# Patient Record
Sex: Female | Born: 1975 | Race: Black or African American | Hispanic: No | Marital: Single | Smoking: Never smoker
Health system: Southern US, Community
[De-identification: ages and names within clinical notes are randomized; demographics above are authoritative.]

---

## 1997-09-08 ENCOUNTER — Emergency Department (HOSPITAL_COMMUNITY): Admission: EM | Admit: 1997-09-08 | Discharge: 1997-09-08 | Payer: Self-pay | Admitting: Emergency Medicine

## 1997-12-23 ENCOUNTER — Emergency Department (HOSPITAL_COMMUNITY): Admission: EM | Admit: 1997-12-23 | Discharge: 1997-12-23 | Payer: Self-pay | Admitting: Internal Medicine

## 1997-12-23 ENCOUNTER — Encounter: Payer: Self-pay | Admitting: Internal Medicine

## 1999-01-07 ENCOUNTER — Inpatient Hospital Stay (HOSPITAL_COMMUNITY): Admission: AD | Admit: 1999-01-07 | Discharge: 1999-01-07 | Payer: Self-pay | Admitting: Obstetrics and Gynecology

## 1999-05-11 ENCOUNTER — Other Ambulatory Visit: Admission: RE | Admit: 1999-05-11 | Discharge: 1999-05-11 | Payer: Self-pay | Admitting: Obstetrics & Gynecology

## 1999-11-21 ENCOUNTER — Emergency Department (HOSPITAL_COMMUNITY): Admission: EM | Admit: 1999-11-21 | Discharge: 1999-11-22 | Payer: Self-pay | Admitting: Emergency Medicine

## 1999-11-21 ENCOUNTER — Encounter: Payer: Self-pay | Admitting: Emergency Medicine

## 1999-12-18 ENCOUNTER — Emergency Department (HOSPITAL_COMMUNITY): Admission: EM | Admit: 1999-12-18 | Discharge: 1999-12-18 | Payer: Self-pay

## 2003-04-24 ENCOUNTER — Ambulatory Visit (HOSPITAL_COMMUNITY): Admission: AD | Admit: 2003-04-24 | Discharge: 2003-04-24 | Payer: Self-pay | Admitting: *Deleted

## 2003-05-31 ENCOUNTER — Emergency Department (HOSPITAL_COMMUNITY): Admission: EM | Admit: 2003-05-31 | Discharge: 2003-05-31 | Payer: Self-pay | Admitting: Emergency Medicine

## 2004-08-02 ENCOUNTER — Inpatient Hospital Stay (HOSPITAL_COMMUNITY): Admission: AD | Admit: 2004-08-02 | Discharge: 2004-08-02 | Payer: Self-pay | Admitting: Obstetrics and Gynecology

## 2004-08-03 ENCOUNTER — Inpatient Hospital Stay (HOSPITAL_COMMUNITY): Admission: AD | Admit: 2004-08-03 | Discharge: 2004-08-03 | Payer: Self-pay | Admitting: Obstetrics and Gynecology

## 2004-10-03 ENCOUNTER — Inpatient Hospital Stay (HOSPITAL_COMMUNITY): Admission: AD | Admit: 2004-10-03 | Discharge: 2004-10-05 | Payer: Self-pay | Admitting: Obstetrics and Gynecology

## 2004-12-27 ENCOUNTER — Inpatient Hospital Stay (HOSPITAL_COMMUNITY): Admission: AD | Admit: 2004-12-27 | Discharge: 2004-12-27 | Payer: Self-pay | Admitting: Obstetrics & Gynecology

## 2005-01-13 IMAGING — US US TRANSVAGINAL NON-OB
1 series · 18 of 25 positions shown · non-contrast
Comparison: none

CLINICAL DATA: Patient is status post therapeutic abortion on 03/25/03.  She has had severe bleeding and pain and currently still has a positive urine pregnancy test.
 TRANSVAGINAL NONOBSTETRIC PELVIC ULTRASOUND, 04/24/03
 Uterus measures 10 x 5.7 x 5.9 cm.  There is asymmetry of the endometrium and endometrial cavity.  At the level of the fundus, a normal-appearing endometrium measures 6 mm in thickness and is homogeneous.  In the lower uterine segment, the endometrium is distorted and measures just over 2 cm in thickness.  Focal retained products are suspected in the lower uterine segment.  No uterine masses otherwise.
 Right ovary measures 2.9 x 1.4 x 1.1 cm and the left ovary 2.5 x 1.3 x 1.3 cm.  Both have a normal appearance.  No evidence of free fluid in the pelvis.
 IMPRESSION 
 Asymmetry of endometrium with heterogeneous and thickened endometrium measuring 2.1 cm in the lower uterine segment.  These findings are suspicious for retained products of conception.

[Series 1: us transvaginal non-ob · 18 of 32 slices shown]
[im 1/32]
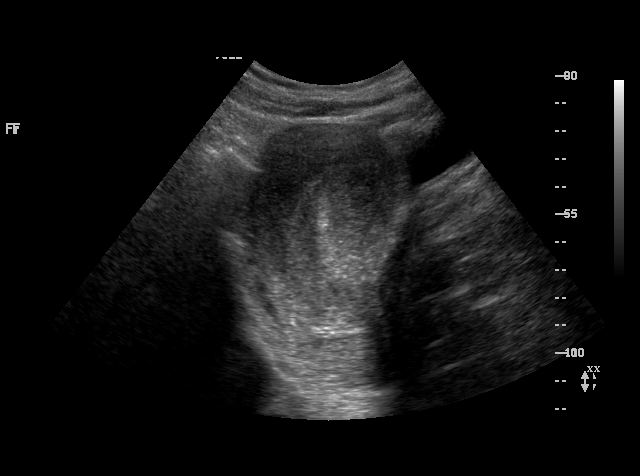
[im 3/32]
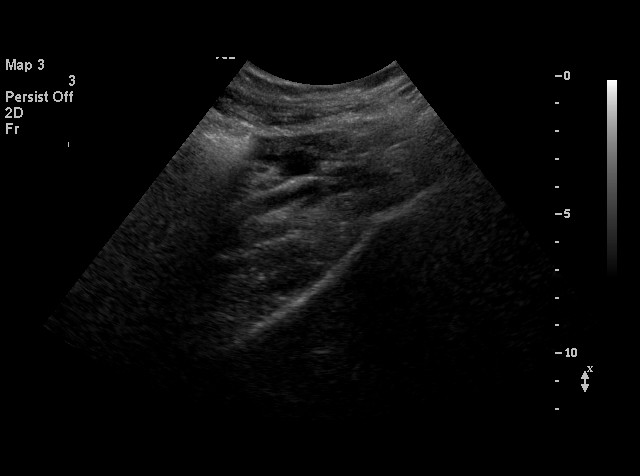
[im 4/32]
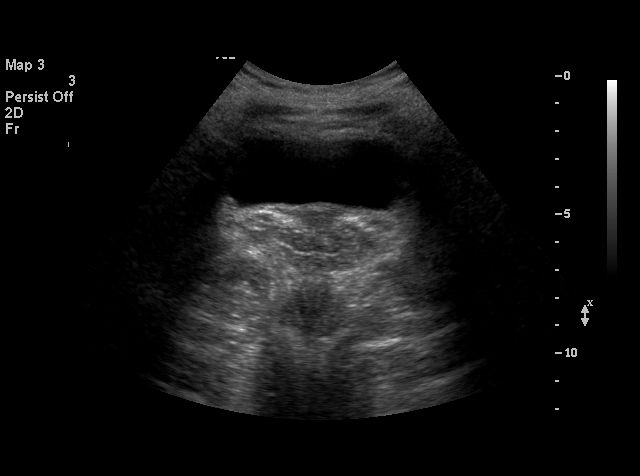
[im 6/32]
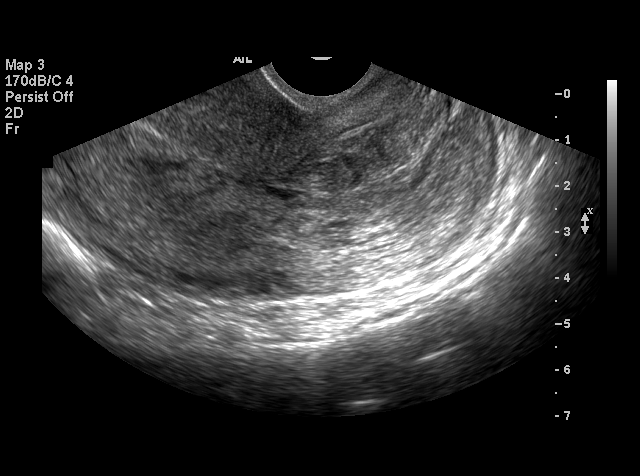
[im 8/32]
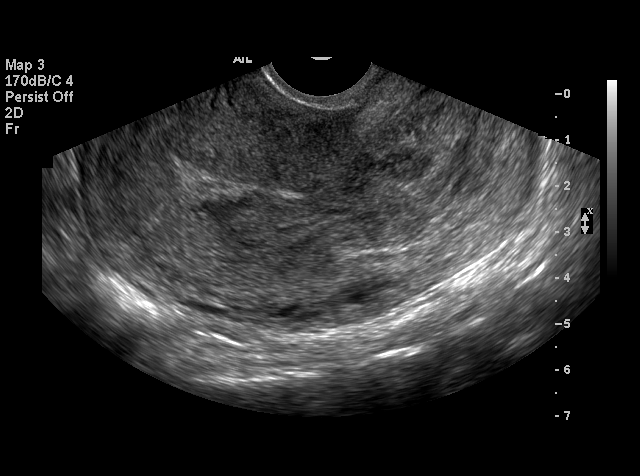
[im 10/32]
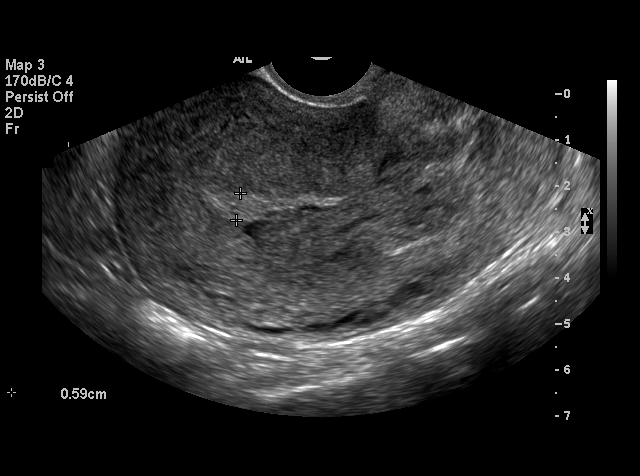
[im 12/32]
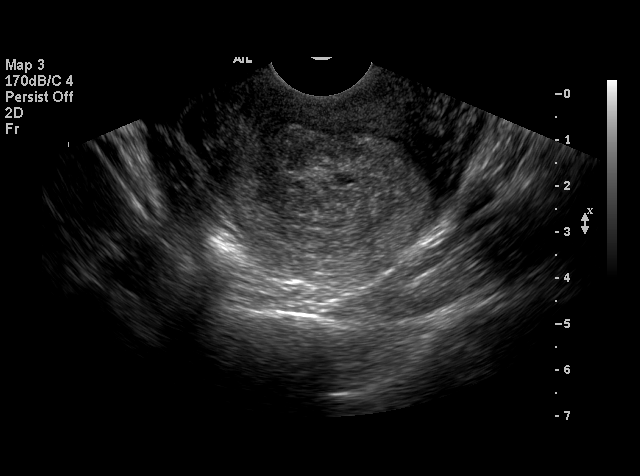
[im 13/32]
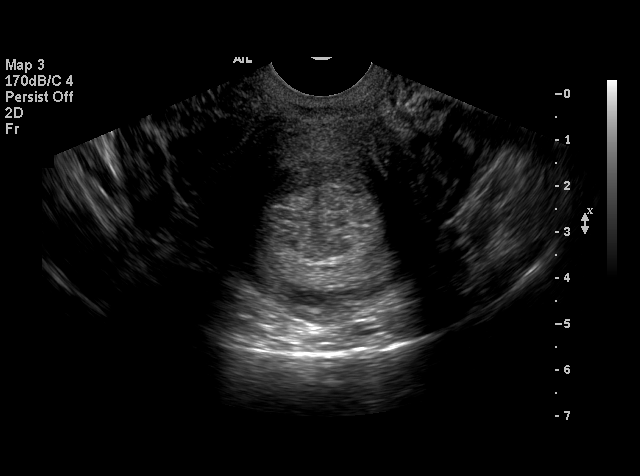
[im 15/32]
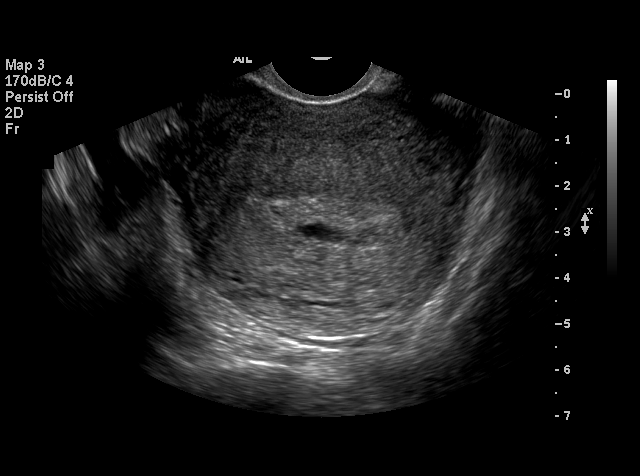
[im 17/32]
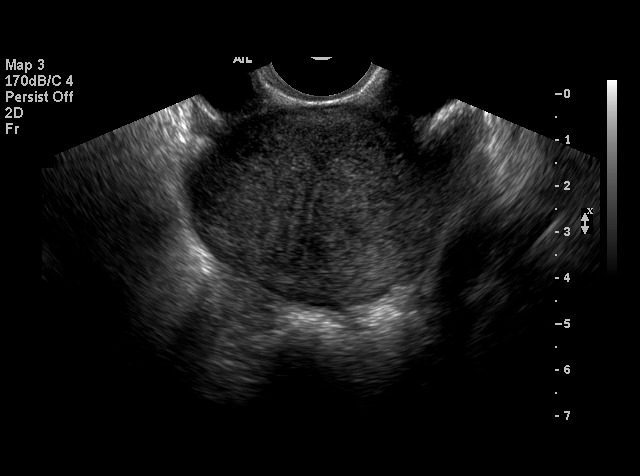
[im 19/32]
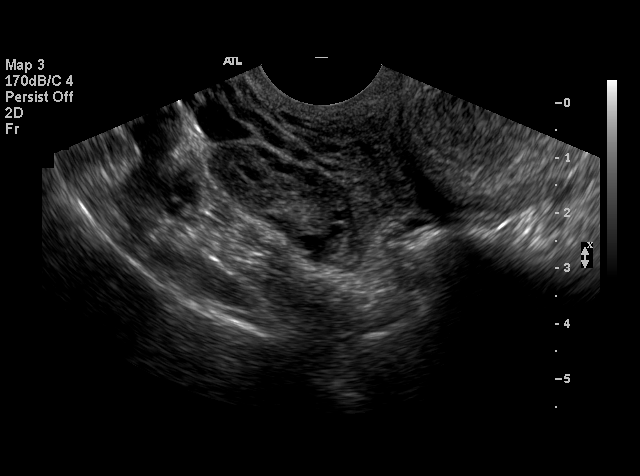
[im 20/32]
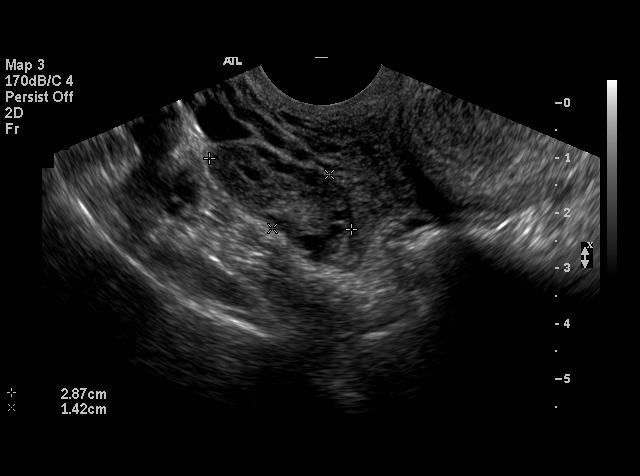
[im 22/32]
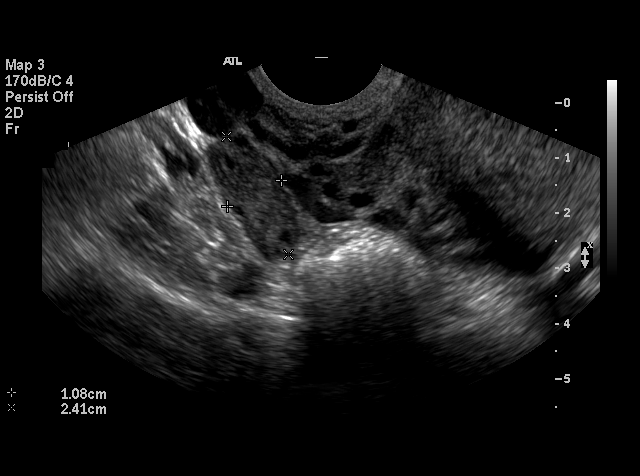
[im 24/32]
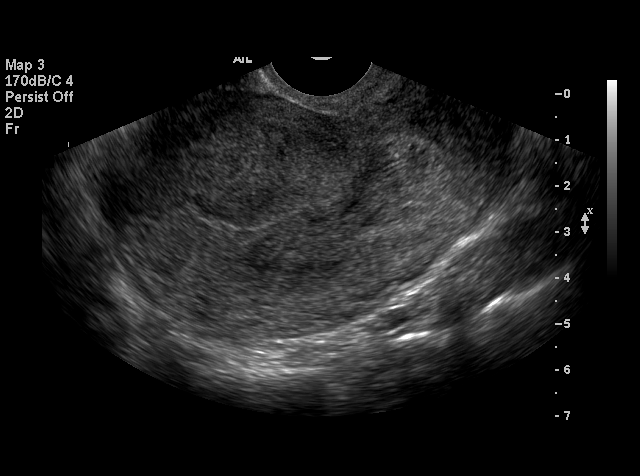
[im 26/32]
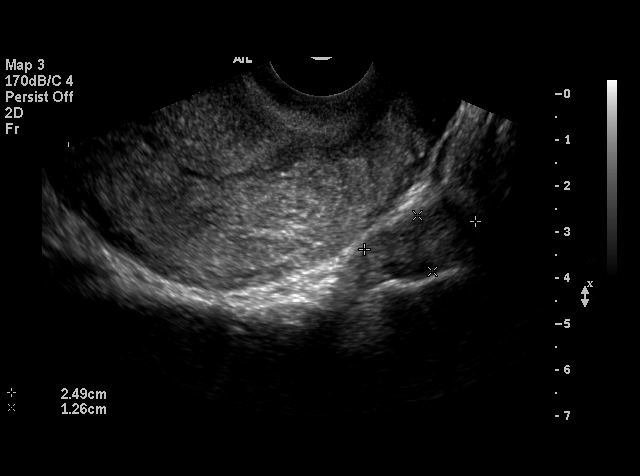
[im 28/32]
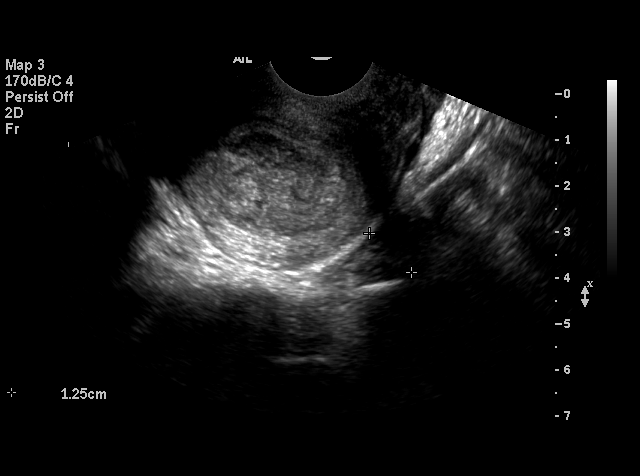
[im 29/32]
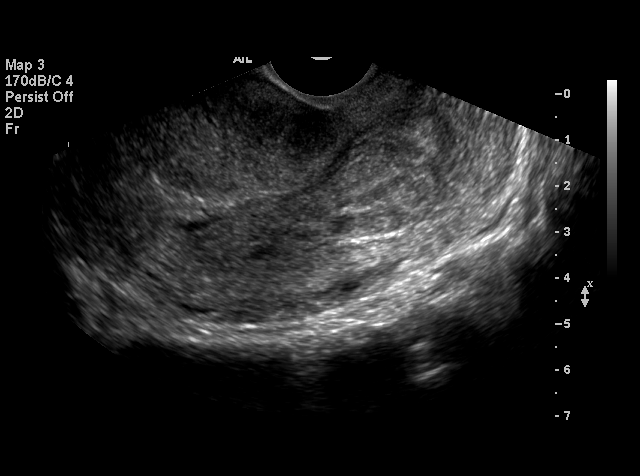
[im 32/32]
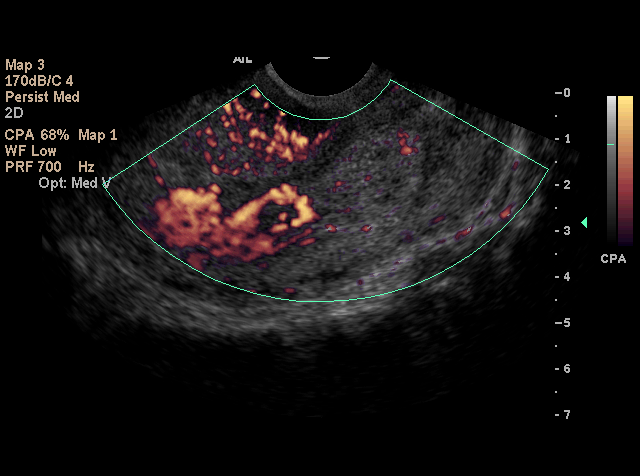

[18 of 25 positions shown; findings below may reference images not displayed]

## 2005-05-21 ENCOUNTER — Inpatient Hospital Stay (HOSPITAL_COMMUNITY): Admission: AD | Admit: 2005-05-21 | Discharge: 2005-05-21 | Payer: Self-pay | Admitting: Obstetrics & Gynecology

## 2005-05-22 ENCOUNTER — Inpatient Hospital Stay (HOSPITAL_COMMUNITY): Admission: AD | Admit: 2005-05-22 | Discharge: 2005-05-22 | Payer: Self-pay | Admitting: Obstetrics & Gynecology

## 2005-07-17 ENCOUNTER — Inpatient Hospital Stay (HOSPITAL_COMMUNITY): Admission: AD | Admit: 2005-07-17 | Discharge: 2005-07-20 | Payer: Self-pay | Admitting: Obstetrics and Gynecology

## 2005-07-17 ENCOUNTER — Inpatient Hospital Stay (HOSPITAL_COMMUNITY): Admission: RE | Admit: 2005-07-17 | Discharge: 2005-07-17 | Payer: Self-pay | Admitting: Obstetrics and Gynecology

## 2006-09-18 IMAGING — US US OB TRANSVAGINAL
1 series · 14 of 26 positions shown · non-contrast
Comparison: none

CLINICAL DATA: 29-year-old.  6 weeks pregnant with bleeding.

[Series 1: us ob transvaginal · 0.23mm/px · 14 of 26 slices shown]
[im 1/26]
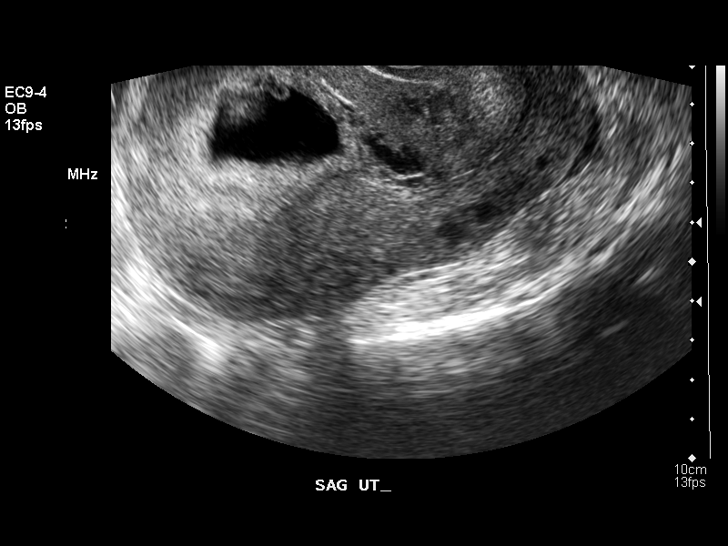
[im 3/26]
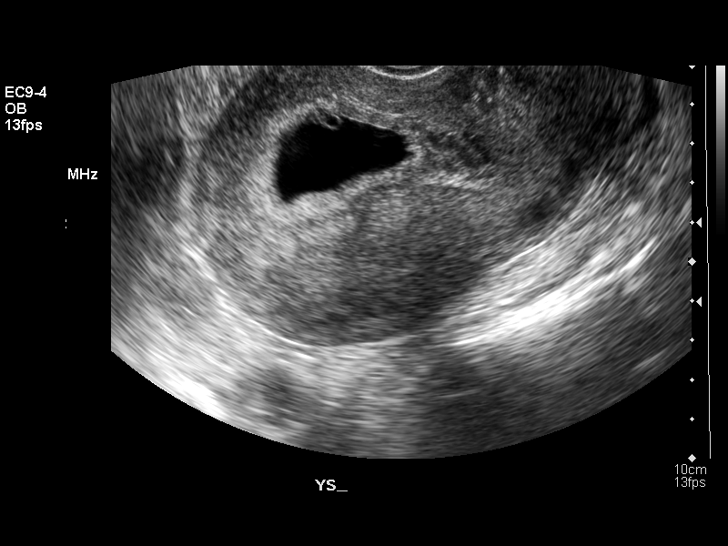
[im 5/26]
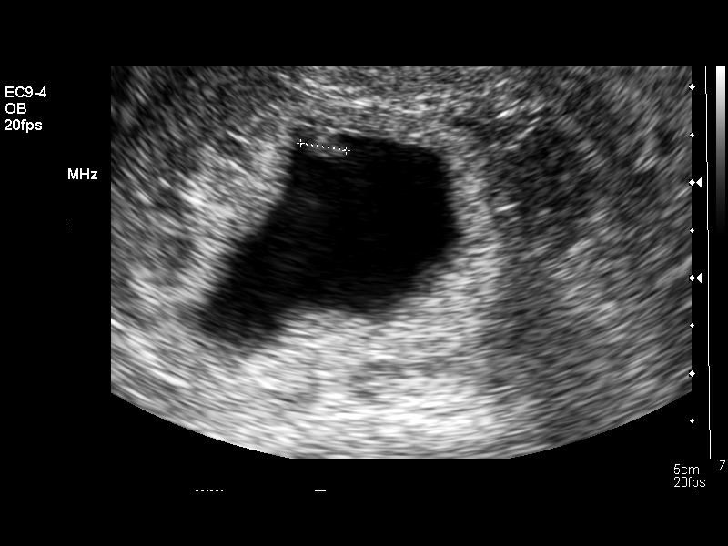
[im 7/26]
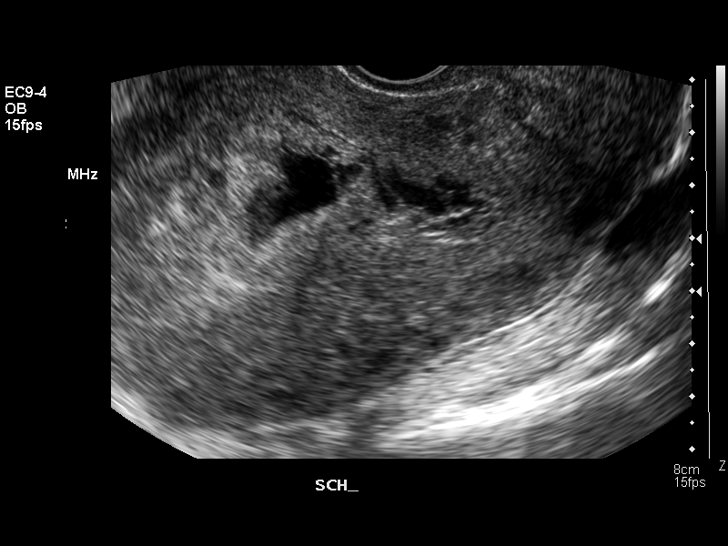
[im 9/26]
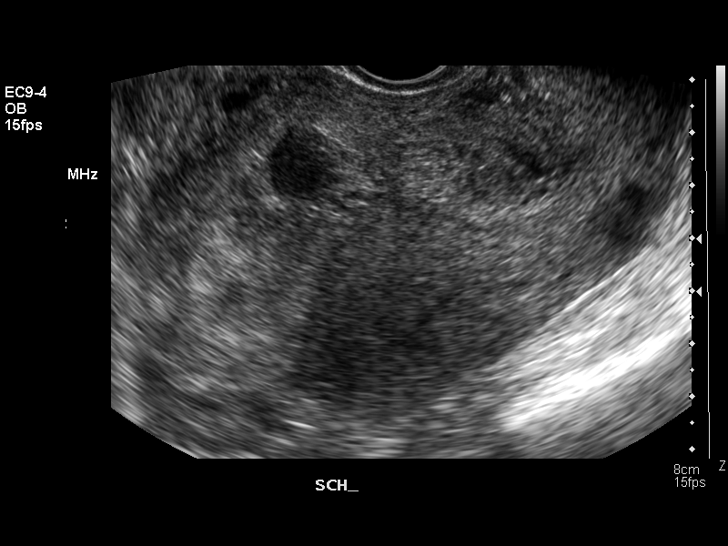
[im 11/26]
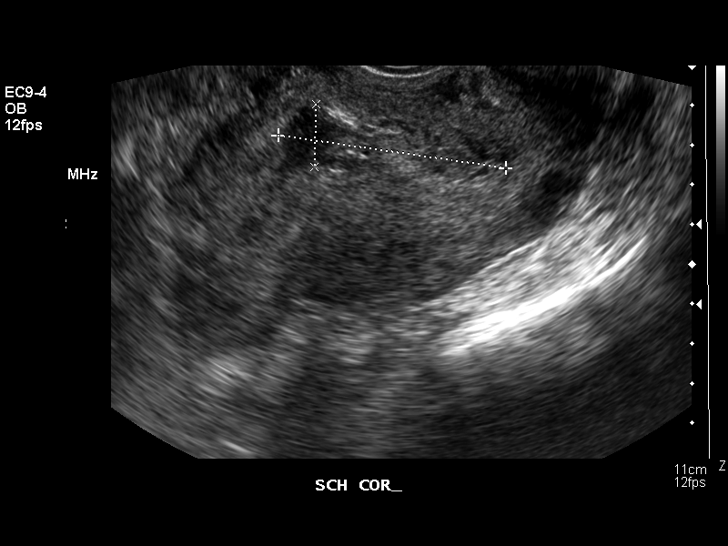
[im 13/26]
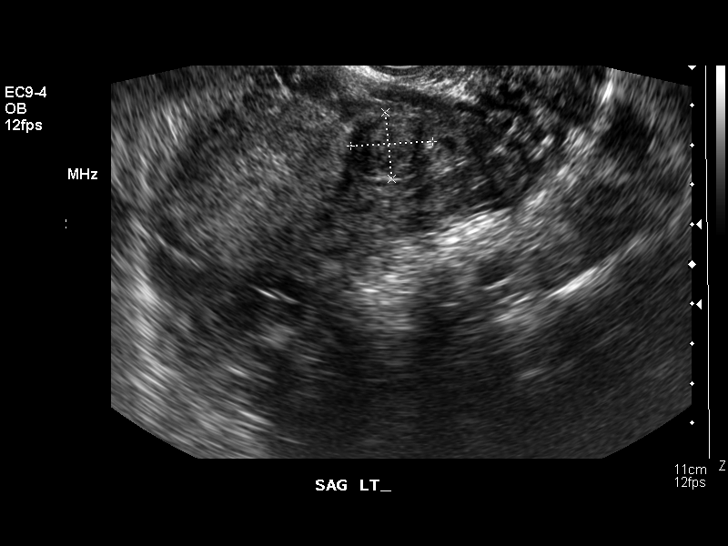
[im 14/26]
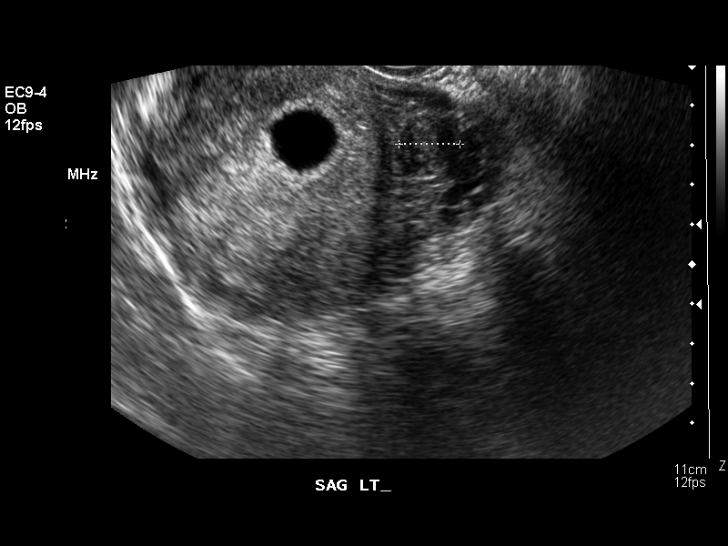
[im 16/26]
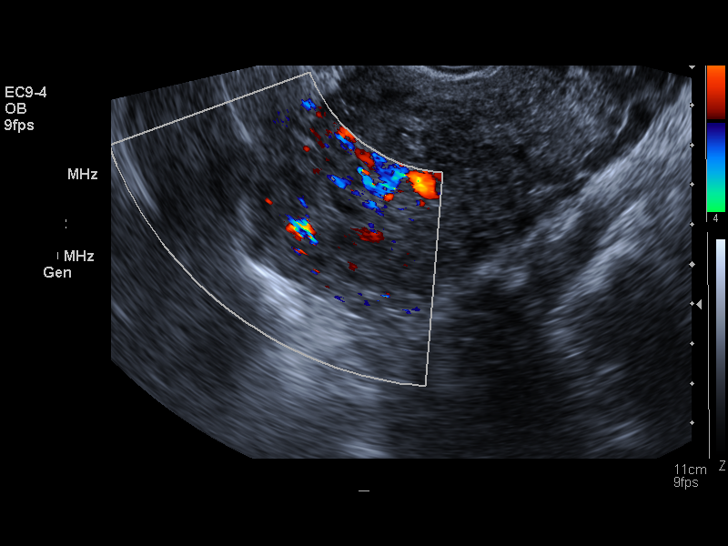
[im 18/26]
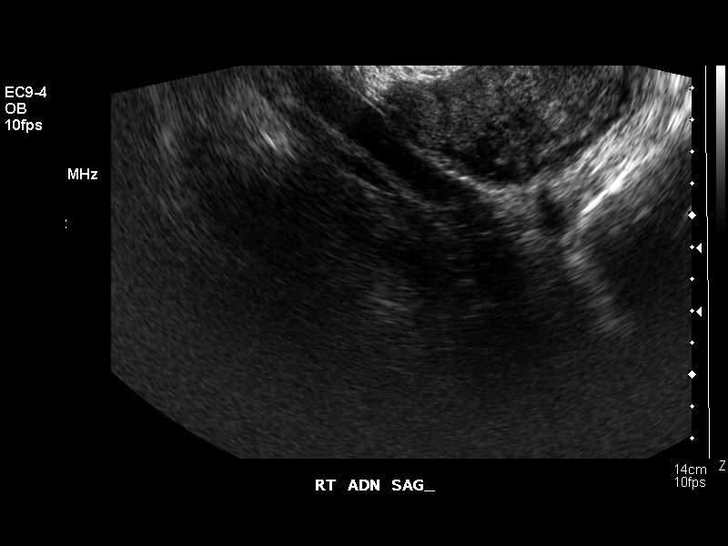
[im 20/26]
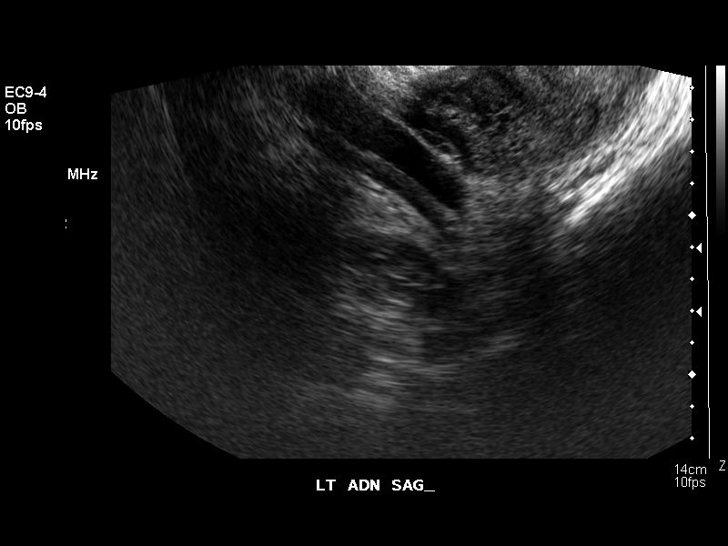
[im 22/26]
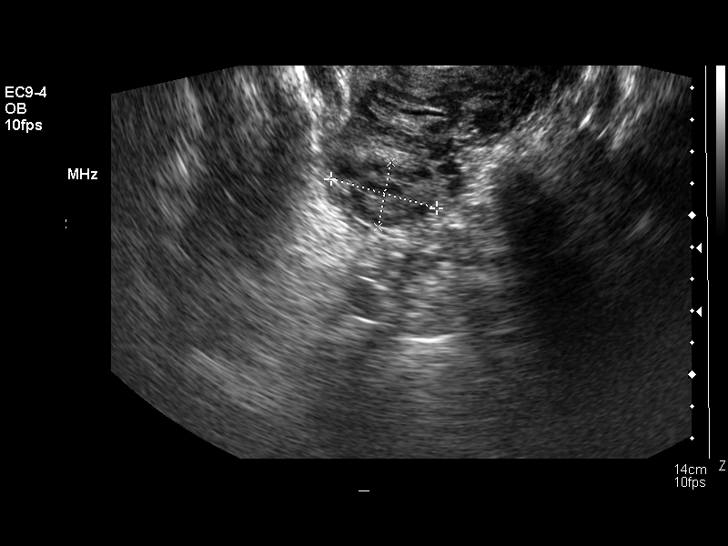
[im 24/26]
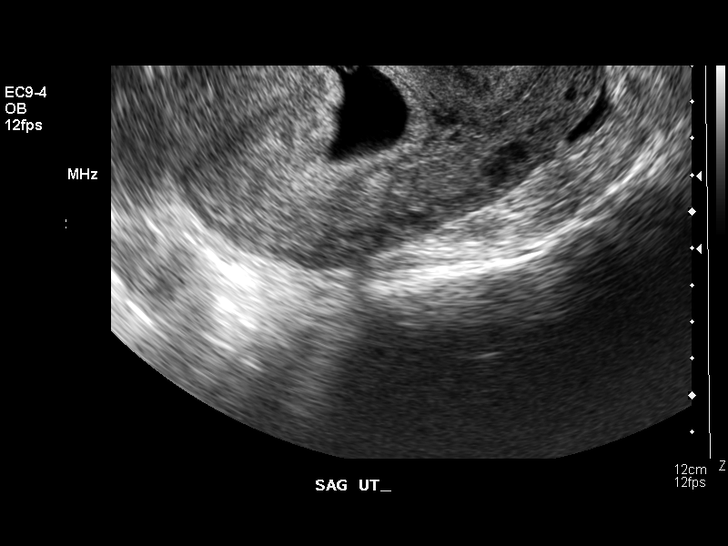
[im 26/26]
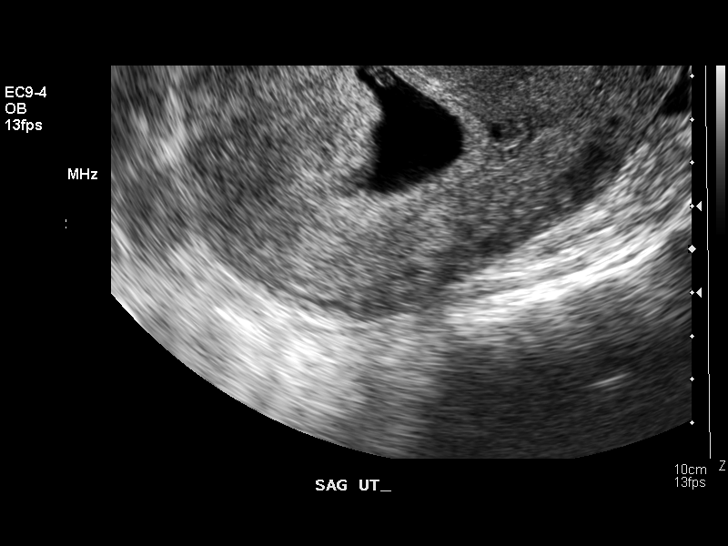

[14 of 26 positions shown; findings below may reference images not displayed]

TRANSVAGINAL OBSTETRICAL ULTRASOUND:

 Number of Fetuses:  1
 Heart Rate: 111 bpm

 Subchorionic hemorrhage measuring 5.8 x 1.6 x 2.6 cm.

 CRL:  0.4 cm  6 w 1 d

 Fetal anatomy could not be evaluated due to the early gestational age.  Yolk sac is visualized.  Gestational sac appears slightly irregular in shape.

 MATERNAL UTERINE AND ADNEXAL FINDINGS
 Cervix not evaluated.  Ovaries are within normal limits.  

 Small left fibroid measuring 2.1 x 1.7 x 1.5 cm.
IMPRESSION: 1.  Single living intrauterine gestation with a heart rate of 111 bpm.  Small yolk sac is seen.  Gestational sac is slightly irregular.  Subchorionic hemorrhage is noted measuring 5.8 x 1.6 x 2.6 cm.  
 2.  Small fibroid is noted in the uterus which measures 2.1 x 1.7 x 1.5 cm.

## 2007-04-08 IMAGING — US US OB COMP +14 WK
1 series · 13 of 27 positions shown · non-contrast
Comparison: none

CLINICAL DATA: Estimate fetal weight.

[Series 1: us ob comp +14 wk · 13 of 27 slices shown]
[im 2/27]
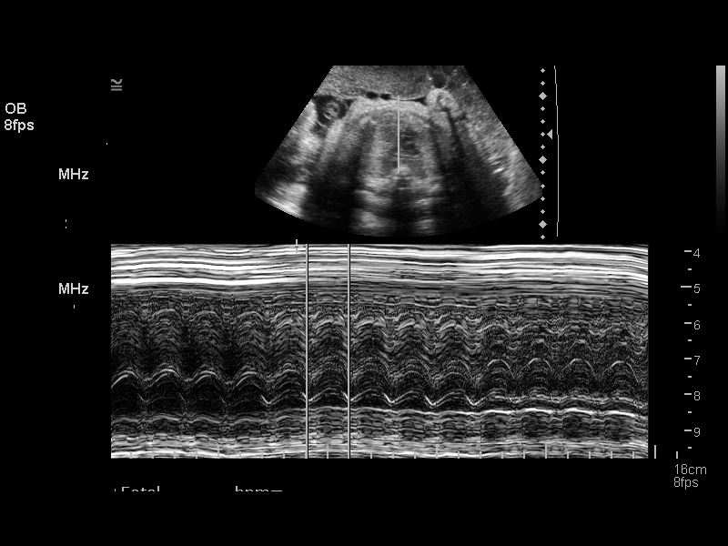
[im 4/27]
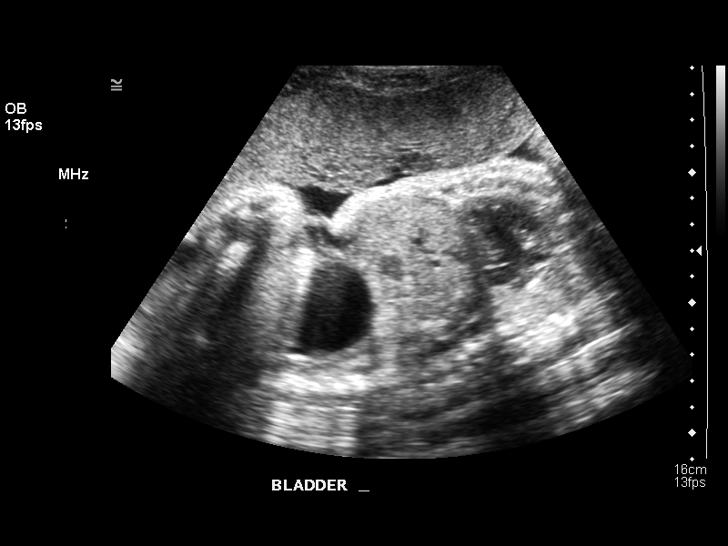
[im 6/27]
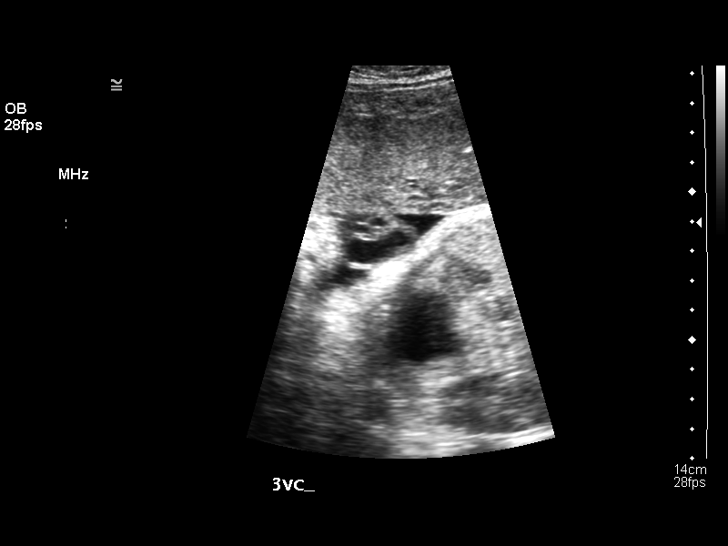
[im 8/27]
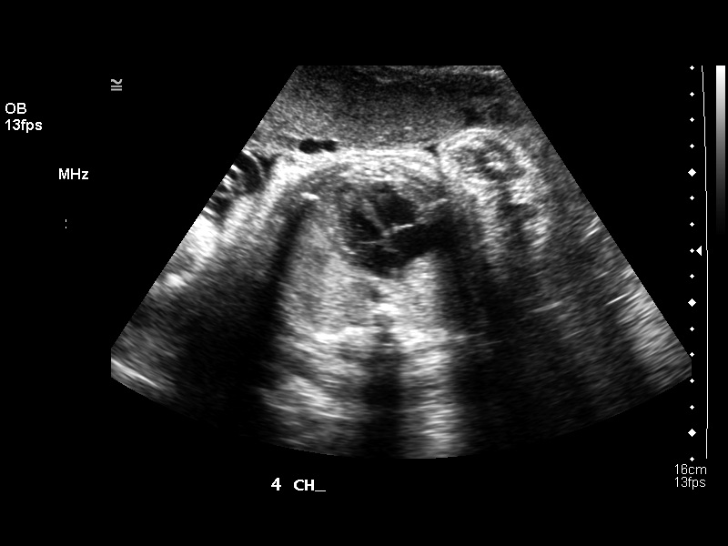
[im 10/27]
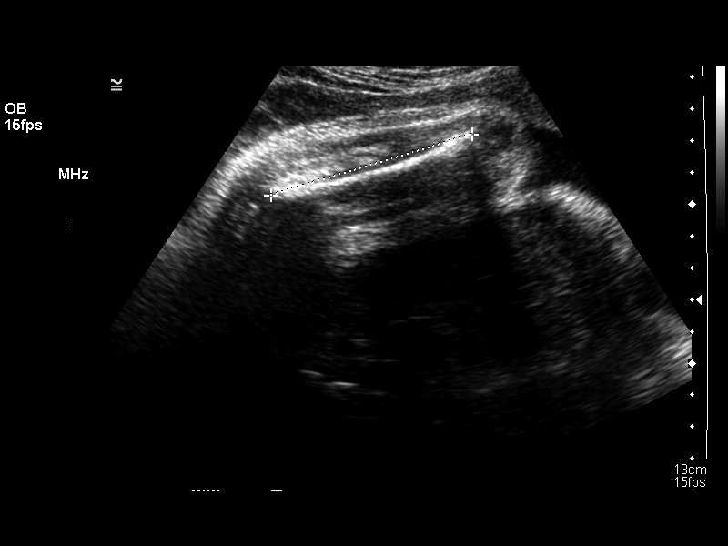
[im 12/27]
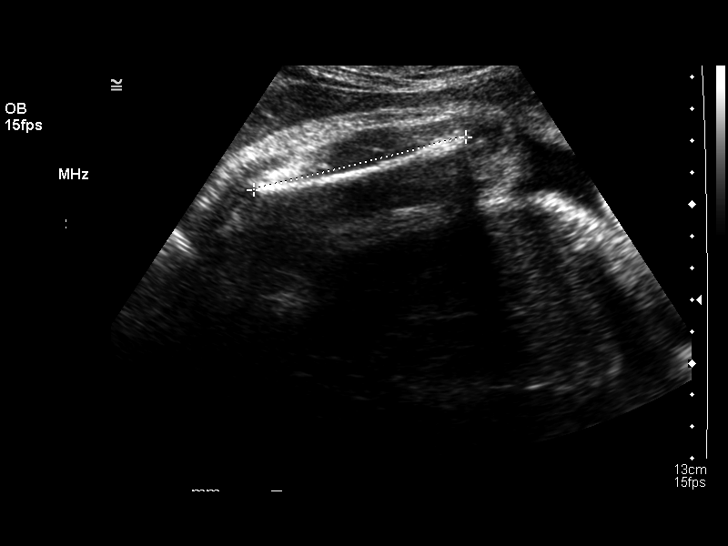
[im 14/27]
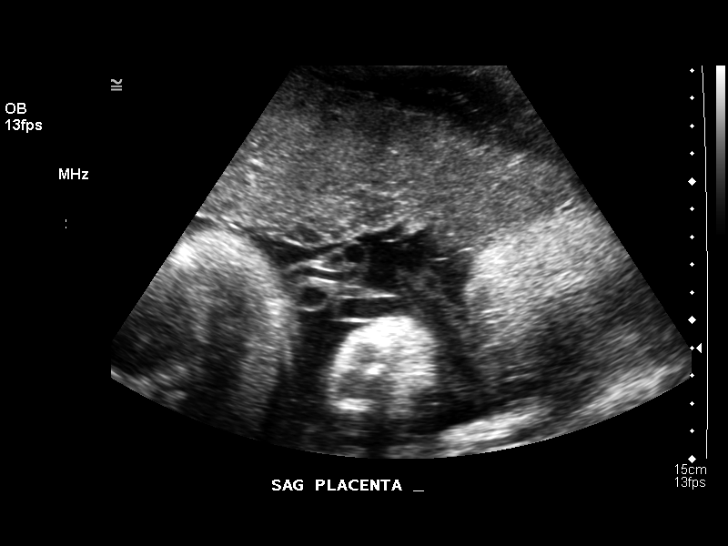
[im 16/27]
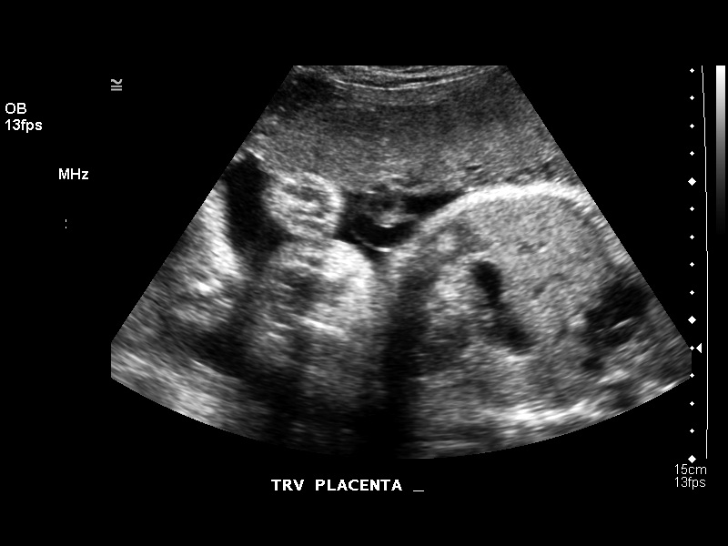
[im 18/27]
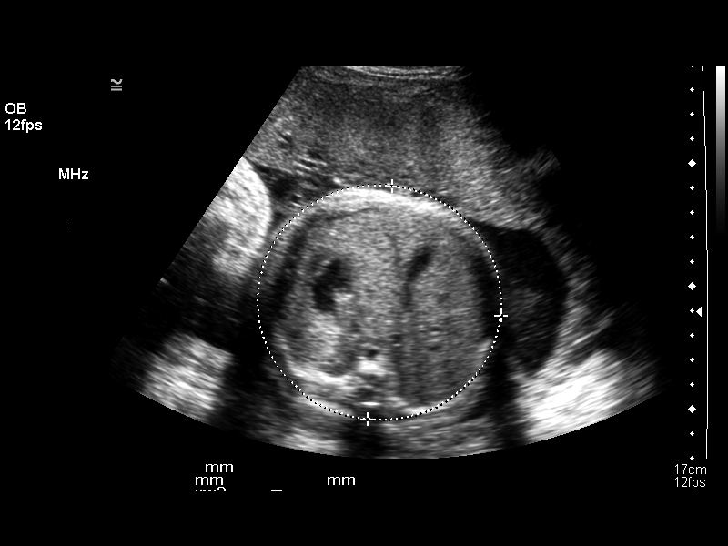
[im 20/27]
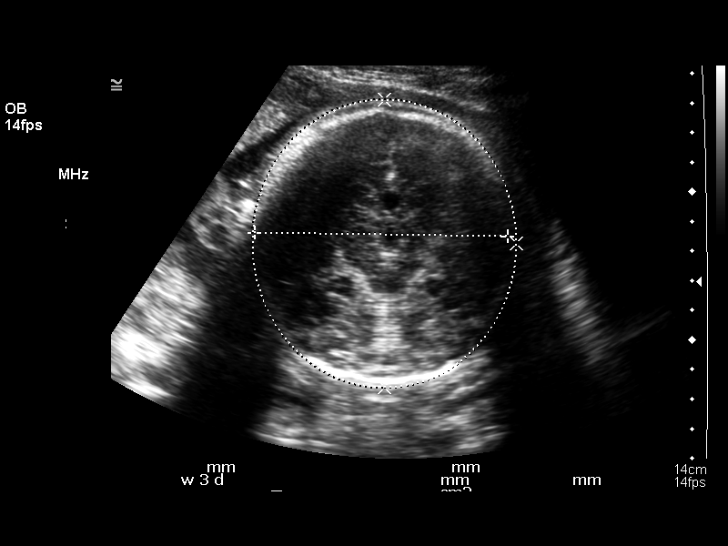
[im 22/27]
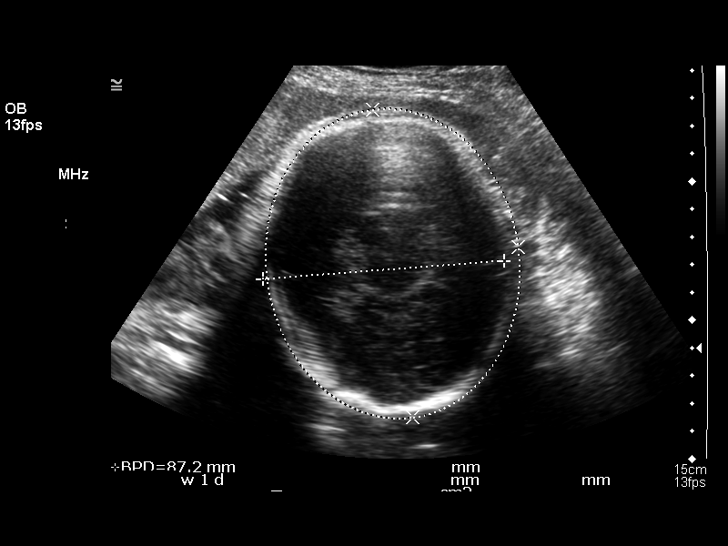
[im 24/27]
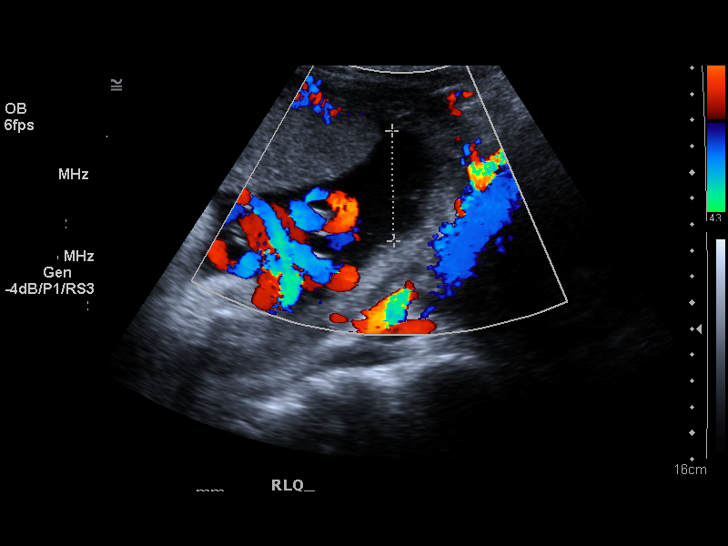
[im 26/27]
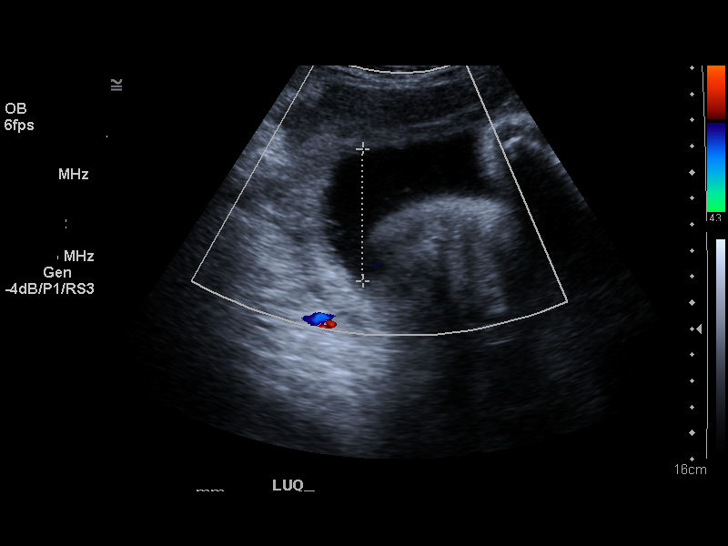

[13 of 27 positions shown; findings below may reference images not displayed]

OBSTETRICAL ULTRASOUND:
 Number of Fetuses: 1
 Heart Rate:  157
 Movement: Yes
 Breathing:  Yes  
 Presentation: Cephalic
 Placental Location: Anterior
 Grade:  I
 Previa:  No
 Amniotic Fluid (Subjective):  Normal
 Amniotic Fluid (Objective):   14.5 cm AFI (5th -95th%ile = 7.7 ? 24.9 cm for 36 wks)

 FETAL BIOMETRY
 BPD:   8.7 cm   35 w 1 d
 HC:   31.8 cm   35 w 5 d
 AC:   31.3 cm   35 w 2 d
 FL:    6.8 cm   34 w 6 d

 MEAN GA:  35 w 2 d  US EDC:  08/19/05
 Assigned GA:  35 w 5 d  Assigned EDC:  08/15/05
 EFW:  3240 g (H) 25th ? 50th%ile (9340 ? 5955 g) For 36 wks

 FETAL ANATOMY
 Lateral Ventricles:    Visualized 
 Thalami/CSP:      Not visualized 
 Posterior Fossa:  Not visualized 
 Nuchal Region:    Not visualized 
 Spine:      Not visualized 
 4 Chamber Heart on Left:      Visualized 
 Stomach on Left:      Visualized 
 3 Vessel Cord:    Visualized 
 Cord Insertion site:    Not visualized 
 Kidneys:  Visualized 
 Bladder:  Visualized 
 Extremities:      Not visualized 

 ADDITIONAL ANATOMY VISUALIZED:  Orbits and diaphragm.  

 MATERNAL UTERINE AND ADNEXAL FINDINGS
 Cervix: Not evaluated > 34 wks
IMPRESSION: 1.  Single living intrauterine fetus in cephalic presentation with subjectively and quantitatively normal amniotic fluid volume.  Estimated mean gestational age by ultrasound today is 35 weeks 2 day with good concordance of the fetal biometric parameters.  This correlates well with the reported assigned gestational age.
 2.  Estimated fetal weight is 3240 + / - 386 grams.  This corresponds to a 25th ? 50th percentile weight for a 36 week gestation.

## 2011-05-28 ENCOUNTER — Telehealth: Payer: Self-pay

## 2011-05-28 NOTE — Telephone Encounter (Signed)
error 

## 2011-05-28 NOTE — Telephone Encounter (Signed)
Pt. Was called and given vit-d protocol. Rx called to [facility] pharmacy # [phone].Disp Vit-D softgels 50,000 units 1 cap 1x wk x 12 wks o rf. PG CMA

## 2017-09-08 ENCOUNTER — Emergency Department (HOSPITAL_COMMUNITY): Admission: EM | Admit: 2017-09-08 | Discharge: 2017-09-08 | Discharge status: Absent without leave | Payer: Self-pay

## 2017-09-08 ENCOUNTER — Other Ambulatory Visit: Payer: Self-pay

## 2017-09-08 ENCOUNTER — Encounter (HOSPITAL_COMMUNITY): Payer: Self-pay | Admitting: Emergency Medicine

## 2017-09-08 ENCOUNTER — Emergency Department (HOSPITAL_COMMUNITY)
Admission: EM | Admit: 2017-09-08 | Discharge: 2017-09-08 | Disposition: A | Discharge status: Home / Self Care | Payer: Self-pay | Attending: Emergency Medicine | Admitting: Emergency Medicine

## 2017-09-08 DIAGNOSIS — Z5321 Procedure and treatment not carried out due to patient leaving prior to being seen by health care provider: Secondary | ICD-10-CM | POA: Insufficient documentation

## 2017-09-08 DIAGNOSIS — F410 Panic disorder [episodic paroxysmal anxiety] without agoraphobia: Secondary | ICD-10-CM | POA: Insufficient documentation

## 2017-09-08 NOTE — ED Notes (Signed)
Patient states she feels better and would like to leave. Discussed risks of leaving with patient. Patient understands but would like to go home.

## 2017-09-08 NOTE — ED Triage Notes (Signed)
Pt presents to ED with complaints of a panic attack. Patient states her mother has been sick and it has been a lot of stress due to her mother's declining health. Patient tachypneic and tearful on arrival. Patient able to speak in full sentences.

## 2017-09-08 NOTE — ED Notes (Signed)
Pt said she was feeling better and wanted to leave.
